# Patient Record
Sex: Female | Born: 1995 | Race: White | Hispanic: No | Marital: Single | Smoking: Never smoker
Health system: Southern US, Community
[De-identification: ages and names within clinical notes are randomized; demographics above are authoritative.]

## PROBLEM LIST (undated history)

## (undated) DIAGNOSIS — T7840XA Allergy, unspecified, initial encounter: Secondary | ICD-10-CM

## (undated) DIAGNOSIS — F419 Anxiety disorder, unspecified: Secondary | ICD-10-CM

## (undated) DIAGNOSIS — D509 Iron deficiency anemia, unspecified: Secondary | ICD-10-CM

## (undated) DIAGNOSIS — O99019 Anemia complicating pregnancy, unspecified trimester: Secondary | ICD-10-CM

## (undated) HISTORY — DX: Allergy, unspecified, initial encounter: T78.40XA

## (undated) HISTORY — DX: Iron deficiency anemia, unspecified: D50.9

## (undated) HISTORY — PX: NO PAST SURGERIES: SHX2092

## (undated) HISTORY — DX: Anemia complicating pregnancy, unspecified trimester: O99.019

## (undated) HISTORY — DX: Anxiety disorder, unspecified: F41.9

---
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
## 2016-04-12 ENCOUNTER — Emergency Department
Admission: EM | Admit: 2016-04-12 | Discharge: 2016-04-12 | Disposition: A | Discharge status: Home / Self Care | Payer: Medicaid Other | Attending: Emergency Medicine | Admitting: Emergency Medicine

## 2016-04-12 ENCOUNTER — Encounter: Payer: Self-pay | Admitting: Urgent Care

## 2016-04-12 DIAGNOSIS — N3001 Acute cystitis with hematuria: Secondary | ICD-10-CM

## 2016-04-12 DIAGNOSIS — Z79899 Other long term (current) drug therapy: Secondary | ICD-10-CM | POA: Insufficient documentation

## 2016-04-12 LAB — URINALYSIS, COMPLETE (UACMP) WITH MICROSCOPIC
Bilirubin Urine: NEGATIVE
Glucose, UA: NEGATIVE mg/dL
Ketones, ur: NEGATIVE mg/dL
Nitrite: NEGATIVE
PROTEIN: NEGATIVE mg/dL
SPECIFIC GRAVITY, URINE: 1.013 (ref 1.005–1.030)
pH: 6 (ref 5.0–8.0)

## 2016-04-12 LAB — POCT PREGNANCY, URINE: PREG TEST UR: NEGATIVE

## 2016-04-12 MED ORDER — PHENAZOPYRIDINE HCL 100 MG PO TABS: 100.0000 mg | ORAL_TABLET | Freq: Three times a day (TID) | ORAL | 0 refills | Status: AC | PRN

## 2016-04-12 MED ORDER — SULFAMETHOXAZOLE-TRIMETHOPRIM 800-160 MG PO TABS: 1.0000 | ORAL_TABLET | Freq: Two times a day (BID) | ORAL | 0 refills | Status: DC

## 2016-04-12 NOTE — ED Triage Notes (Signed)
Patient presents with complaints of lumbosacral pain that has been worsening for 2 weeks. Patient with a physically demanding job; attributed back pain to her work. Patient reports that she woke up this morning "feeling bad"; thought that she had "a bug" - went back to bed and woke up with suprapubic pain. (+) dysuria with gross hematuria reported by patient.

## 2016-04-12 NOTE — ED Provider Notes (Signed)
[facility] Emergency Department Provider Note  ____________________________________________  Time seen: Approximately 7:52 PM  I have reviewed the triage vital signs and the nursing notes.   HISTORY  Chief Complaint Back Pain and Dysuria    HPI Kristen Estrada is a 21 y.o. female , NAD, presents to the emergency department with 2 day history of back pain, frequent urination and hematuria. Patient states she has had lower back pain over the last couple of weeks. Woke today with some mild nausea and vomiting. States she took a nap and when she woke up she had increased frequency of urination and some blood in the urine. Denies any fevers, chills or body aches. Has had no injury or trauma to the abdomen. Denies any pelvic pain, vaginal discharge or vaginal bleeding. Denies any radiation of her back pain. Denies any abdominal pain. No history of kidney stones. Denies any lower extremity swelling or edema.   Past Medical History:  Diagnosis Date  . Allergy    Seasonal  . Anxiety   . Iron deficiency anemia of pregnancy 2014    Patient Active Problem List   Diagnosis Date Noted  . Absolute anemia 09/17/2012    Past Surgical History:  Procedure Laterality Date  . NO PAST SURGERIES     Confirmed 03/22/15    Prior to Admission medications   Medication Sig Start Date End Date Taking? Authorizing Provider  etonogestrel (NEXPLANON) 68 MG IMPL implant 1 each by Subdermal route once. 10/19/12   Historical Provider, MD  phenazopyridine (PYRIDIUM) 100 MG tablet Take 1 tablet (100 mg total) by mouth 3 (three) times daily as needed for pain (May take 1-2 as needed three times daily). 04/12/16   Jami L Hagler, PA-C  sertraline (ZOLOFT) 50 MG tablet Take 50 mg by mouth daily.    Historical Provider, MD  sulfamethoxazole-trimethoprim (BACTRIM DS,SEPTRA DS) 800-160 MG tablet Take 1 tablet by mouth 2 (two) times daily. 04/12/16   Jami L Hagler, PA-C    Allergies Patient has  no known allergies.  Family History  Problem Relation Age of Onset  . Depression Mother     Social History Social History  Substance Use Topics  . Smoking status: Never Smoker  . Smokeless tobacco: Never Used  . Alcohol use No     Review of Systems  Constitutional: No fever/chills Cardiovascular: No chest pain. Respiratory: No shortness of breath.  Gastrointestinal:Positive nausea and vomiting. No abdominal pain.  No diarrhea.  No constipation. Genitourinary: Positive increased urinary frequency, hematuria. Negative for dysuria, pelvic pain, vaginal discharge, vaginal bleeding. No urinary hesitancy, urgency. Musculoskeletal: Positive for back pain without radiation.  Skin: Negative for rash. Neurological: Negative for saddle paresthesias or loss of bowel or bladder control  ____________________________________________   PHYSICAL EXAM:  VITAL SIGNS: ED Triage Vitals  Enc Vitals Group     BP 04/12/16 1907 123/80     Pulse Rate 04/12/16 1907 92     Resp 04/12/16 1907 16     Temp 04/12/16 1907 98.9 F (37.2 C)     Temp Source 04/12/16 1907 Oral     SpO2 04/12/16 1907 98 %     Weight 04/12/16 1907 150 lb (68 kg)     Height 04/12/16 1907 5\' 3"  (1.6 m)     Head Circumference --      Peak Flow --      Pain Score 04/12/16 1908 6     Pain Loc --      Pain  Edu? --      Excl. in GC? --      Constitutional: Alert and oriented. Well appearing and in no acute distress. Eyes: Conjunctivae are normal.  Head: Atraumatic. Neck: Supple with full range of motion. Hematological/Lymphatic/Immunilogical: No cervical lymphadenopathy. Cardiovascular: Normal rate, regular rhythm. Normal S1 and S2.  Good peripheral circulation. Respiratory: Normal respiratory effort without tachypnea or retractions. Lungs CTAB breath sounds noted in all lung fields. No wheeze, rhonchi, rales. Gastrointestinal: Mild tenderness to deep palpation of the suprapubic region without distention or guarding.  All other quadrants of the abdomen was soft and nontender without distention or guarding. No rebound or rigidity. No CVA tenderness. Musculoskeletal: No lower extremity tenderness nor edema.  No joint effusions. Neurologic:  Normal speech and language. No gross focal neurologic deficits are appreciated.  Skin:  Skin is warm, dry and intact. No rash noted. Psychiatric: Mood and affect are normal. Speech and behavior are normal. Patient exhibits appropriate insight and judgement.   ____________________________________________   LABS (all labs ordered are listed, but only abnormal results are displayed)  Labs Reviewed  URINALYSIS, COMPLETE (UACMP) WITH MICROSCOPIC - Abnormal; Notable for the following:       Result Value   Color, Urine STRAW (*)    APPearance CLEAR (*)    Hgb urine dipstick LARGE (*)    Leukocytes, UA MODERATE (*)    Bacteria, UA RARE (*)    Squamous Epithelial / LPF 0-5 (*)    All other components within normal limits  URINE CULTURE  POCT PREGNANCY, URINE   ____________________________________________  EKG  None ____________________________________________  RADIOLOGY  None ____________________________________________    PROCEDURES  Procedure(s) performed: None   Procedures   Medications - No data to display   ____________________________________________   INITIAL IMPRESSION / ASSESSMENT AND PLAN / ED COURSE  Pertinent labs & imaging results that were available during my care of the patient were reviewed by me and considered in my medical decision making (see chart for details).  Clinical Course     Patient's diagnosis is consistent with Acute cystitis with hematuria. Patient will be discharged home with prescriptions for Bactrim DS and Pyridium to take as directed. Patient is encouraged to increase water intake and urinate often. Patient is to follow up with [facility] if symptoms persist  past this treatment course. Patient is given ED precautions to return to the ED for any worsening or new symptoms.    ____________________________________________  FINAL CLINICAL IMPRESSION(S) / ED DIAGNOSES  Final diagnoses:  Acute cystitis with hematuria      NEW MEDICATIONS STARTED DURING THIS VISIT:  Discharge Medication List as of 04/12/2016  7:59 PM    START taking these medications   Details  phenazopyridine (PYRIDIUM) 100 MG tablet Take 1 tablet (100 mg total) by mouth 3 (three) times daily as needed for pain (May take 1-2 as needed three times daily)., Starting Sat 04/12/2016, Print    sulfamethoxazole-trimethoprim (BACTRIM DS,SEPTRA DS) 800-160 MG tablet Take 1 tablet by mouth 2 (two) times daily., Starting Sat 04/12/2016, Print             Hope PigeonJami L Hagler, PA-C 04/12/16 2008    Myrna Blazeravid Matthew Schaevitz, MD 04/12/16 2252

## 2016-04-12 NOTE — Discharge Instructions (Signed)
Rest.  Push fluids. Take medications as prescribed and to completion.

## 2016-04-15 LAB — URINE CULTURE: Special Requests: NORMAL

## 2016-04-16 NOTE — Progress Notes (Signed)
MEDICATION RELATED CONSULT NOTE - INITIAL   Pharmacy Consult for ED culture results   No Known Allergies  Patient Measurements: Height: 5\' 3"  (160 cm) Weight: 150 lb (68 kg) IBW/kg (Calculated) : 52.4  Microbiology: Recent Results (from the past 720 hour(s))  Urine culture     Status: Abnormal   Collection Time: 04/12/16  7:08 PM  Result Value Ref Range Status   Specimen Description URINE, CLEAN CATCH  Final   Special Requests Normal  Final   Culture >=100,000 COLONIES/mL ESCHERICHIA COLI (A)  Final   Report Status 04/15/2016 FINAL  Final   Organism ID, Bacteria ESCHERICHIA COLI (A)  Final      Susceptibility   Escherichia coli - MIC*    AMPICILLIN >=32 RESISTANT Resistant     CEFAZOLIN <=4 SENSITIVE Sensitive     CEFTRIAXONE <=1 SENSITIVE Sensitive     CIPROFLOXACIN <=0.25 SENSITIVE Sensitive     GENTAMICIN 8 INTERMEDIATE Intermediate     IMIPENEM <=0.25 SENSITIVE Sensitive     NITROFURANTOIN 32 SENSITIVE Sensitive     TRIMETH/SULFA >=320 RESISTANT Resistant     AMPICILLIN/SULBACTAM >=32 RESISTANT Resistant     PIP/TAZO <=4 SENSITIVE Sensitive     Extended ESBL NEGATIVE Sensitive     * >=100,000 COLONIES/mL ESCHERICHIA COLI     Assessment: 21 yo female seen in ED on 1/13 with UTI/Cystitis. Patient Discharged on Bactrim BID x 7 days.  Cultures growing E.Coli resistant to Bactrim.   Plan:  After discussion with Dr. Presley RaddleJohnathan Williams, Will change Abx to Keflex 500mg  BID x 7 days.  Spoke with patient at 1415 on 1/17 and reviewed UCx results and explained need for change of Abx. Patient verbalized understanding that she was to start new Abx (Keflex) and stop taking Bactrim at this time.  RX was called into [facility] on [address] in [place] per patient's request.   Gardner CandleSheema M Dymond Gutt, PharmD, BCPS Clinical Pharmacist 04/16/2016 3:04 PM

## 2016-05-16 ENCOUNTER — Emergency Department
Admission: EM | Admit: 2016-05-16 | Discharge: 2016-05-16 | Disposition: A | Discharge status: Home / Self Care | Payer: Self-pay | Attending: Emergency Medicine | Admitting: Emergency Medicine

## 2016-05-16 ENCOUNTER — Encounter: Payer: Self-pay | Admitting: Emergency Medicine

## 2016-05-16 ENCOUNTER — Emergency Department: Payer: Self-pay

## 2016-05-16 DIAGNOSIS — Z79899 Other long term (current) drug therapy: Secondary | ICD-10-CM | POA: Insufficient documentation

## 2016-05-16 DIAGNOSIS — M545 Low back pain, unspecified: Secondary | ICD-10-CM

## 2016-05-16 DIAGNOSIS — R102 Pelvic and perineal pain: Secondary | ICD-10-CM

## 2016-05-16 DIAGNOSIS — N3090 Cystitis, unspecified without hematuria: Secondary | ICD-10-CM | POA: Insufficient documentation

## 2016-05-16 LAB — URINALYSIS, ROUTINE W REFLEX MICROSCOPIC
BILIRUBIN URINE: NEGATIVE
Glucose, UA: NEGATIVE mg/dL
Hgb urine dipstick: NEGATIVE
Ketones, ur: NEGATIVE mg/dL
NITRITE: NEGATIVE
PH: 6 (ref 5.0–8.0)
Protein, ur: NEGATIVE mg/dL
SPECIFIC GRAVITY, URINE: 1.018 (ref 1.005–1.030)

## 2016-05-16 LAB — POCT PREGNANCY, URINE: PREG TEST UR: NEGATIVE

## 2016-05-16 MED ORDER — SULFAMETHOXAZOLE-TRIMETHOPRIM 800-160 MG PO TABS: 1.0000 | ORAL_TABLET | Freq: Two times a day (BID) | ORAL | 0 refills | Status: AC

## 2016-05-16 NOTE — Discharge Instructions (Signed)
Take the prescription meds as directed. Follow[facility]up with [facility] as scheduled. Return to the ED for any worsening symptoms. Take the antibiotics as directed.

## 2016-05-16 NOTE — ED Provider Notes (Signed)
[facility] Emergency Department Provider Note ____________________________________________  Time seen: 1515  I have reviewed the triage vital signs and the nursing notes.  HISTORY  Chief Complaint  Back Pain  HPI Kristen Estrada is a 21 y.o. female presents to the ED for evaluation of 1-day complaint of back pain with referral to the right pelvis. She denies fevers, chills, sweats, or dysuria. She dose ibuprofen with limited relief of her pain.  She is "late" for her menses, reporting a LMP of 04/10/16. She is without nausea, vomiting, or diarrhea. She denies any recent injury, accident, or trauma. She denies vaginal discharge, abnormal vaginal bleeding, or dyspareunia.   Past Medical History:  Diagnosis Date  . Allergy    Seasonal  . Anxiety   . Iron deficiency anemia of pregnancy 2014    Patient Active Problem List   Diagnosis Date Noted  . Absolute anemia 09/17/2012    Past Surgical History:  Procedure Laterality Date  . NO PAST SURGERIES     Confirmed 03/22/15    Prior to Admission medications   Medication Sig Start Date End Date Taking? Authorizing Provider  etonogestrel (NEXPLANON) 68 MG IMPL implant 1 each by Subdermal route once. 10/19/12   Historical Provider, MD  phenazopyridine (PYRIDIUM) 100 MG tablet Take 1 tablet (100 mg total) by mouth 3 (three) times daily as needed for pain (May take 1-2 as needed three times daily). 04/12/16   Jami L Hagler, PA-C  sertraline (ZOLOFT) 50 MG tablet Take 50 mg by mouth daily.    Historical Provider, MD  sulfamethoxazole-trimethoprim (BACTRIM DS,SEPTRA DS) 800-160 MG tablet Take 1 tablet by mouth 2 (two) times daily. 05/16/16   Charlesetta IvoryJenise V Bacon Viveca Beckstrom, PA-C    Allergies Patient has no known allergies.  Family History  Problem Relation Age of Onset  . Depression Mother     Social History Social History  Substance Use Topics  . Smoking status: Never Smoker  . Smokeless tobacco: Never Used  . Alcohol  use No    Review of Systems  Constitutional: Negative for fever. Cardiovascular: Negative for chest pain. Respiratory: Negative for shortness of breath. Genitourinary: Negative for dysuria. Musculoskeletal: Positive for back pain. Skin: Negative for rash. Neurological: Negative for headaches, focal weakness or numbness. ____________________________________________  PHYSICAL EXAM:  VITAL SIGNS: ED Triage Vitals  Enc Vitals Group     BP 05/16/16 1422 121/77     Pulse Rate 05/16/16 1422 93     Resp 05/16/16 1422 20     Temp 05/16/16 1422 99.1 F (37.3 C)     Temp Source 05/16/16 1422 Oral     SpO2 05/16/16 1422 97 %     Weight 05/16/16 1423 170 lb (77.1 kg)     Height 05/16/16 1423 5\' 3"  (1.6 m)     Head Circumference --      Peak Flow --      Pain Score 05/16/16 1423 5     Pain Loc --      Pain Edu? --      Excl. in GC? --    Constitutional: Alert and oriented. Well appearing and in no distress. Head: Normocephalic and atraumatic. Cardiovascular: Normal rate, regular rhythm. Normal distal pulses. Respiratory: Normal respiratory effort. No wheezes/rales/rhonchi. Gastrointestinal: Soft and nontender. No distention, rebound, guarding, or rigidity. No CVA tenderness. Mildly tender to palp over the right lower pelvic region. GU: Declined by patient Musculoskeletal: Normal spinal alignment without midline tenderness, spasm, or deformity. Nontender with normal  range of motion in all extremities.  Neurologic:  Normal gait without ataxia. Normal speech and language. No gross focal neurologic deficits are appreciated. Skin:  Skin is warm, dry and intact. No rash noted. ____________________________________________   LABS (pertinent positives/negatives) Labs Reviewed  URINALYSIS, ROUTINE W REFLEX MICROSCOPIC - Abnormal; Notable for the following:       Result Value   Color, Urine YELLOW (*)    APPearance HAZY (*)    Leukocytes, UA LARGE (*)    Bacteria, UA RARE (*)    Squamous  Epithelial / LPF 6-30 (*)    All other components within normal limits  POC URINE PREG, ED  POCT PREGNANCY, URINE  ____________________________________________   RADIOLOGY  Pelvic/Transvaginal US IMPRESSION: Normal pelvic ultrasound. ____________________________________________  INITIAL IMPRESSION / ASSESSMENT AND PLAN / ED COURSE  Female patient with a reassuring pelvic ultrasound with her sudden onset of right-sided low back pain with referral into the right pelvis. Patient's exam is otherwise benign at this time. The patient has declined a pelvic exam for this complaint. She is advised her symptoms cannot be fully evaluated without a pelvic exam. She reports she is scheduled to see her OB provider for her routine annual exam next week. She is further advised that she should return to the ED immediately for any worsening pain, bleeding, or disability. He is discharged with a prescription for Bactrim for mild cystitis. She will return to the ED as needed. ____________________________________________  FINAL CLINICAL IMPRESSION(S) / ED DIAGNOSES  Final diagnoses:  Pelvic pain  Acute right-sided low back pain without sciatica  Cystitis      Lissa Hoard, PA-C 05/16/16 1806    Phineas Semen, MD 05/16/16 2313

## 2016-05-16 NOTE — ED Triage Notes (Signed)
Pt with low back pain started yesterday.

## 2017-06-10 IMAGING — US US TRANSVAGINAL NON-OB
1 series · 14 of 25 positions shown · non-contrast
Comparison: None

CLINICAL DATA: Pelvic pain, right lower quadrant pain for 1

EXAM:
TRANSABDOMINAL AND TRANSVAGINAL ULTRASOUND OF PELVIS
TECHNIQUE: Both transabdominal and transvaginal ultrasound examinations of the
pelvis were performed. Transabdominal technique was performed for
global imaging of the pelvis including uterus, ovaries, adnexal
regions, and pelvic cul-de-sac. It was necessary to proceed with
endovaginal exam following the transabdominal exam to visualize the
endometrium and ovaries.

[Series 1: us transvaginal non-ob · 0.22mm/px · 14 of 92 slices shown]
[im 1/92]
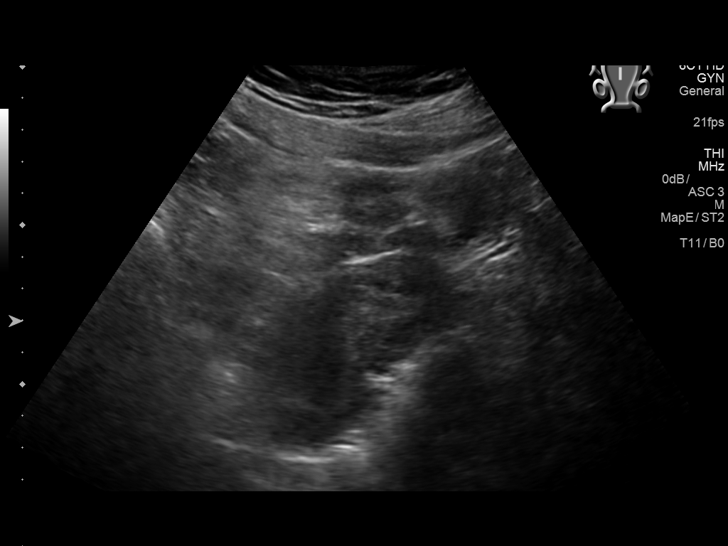
[im 8/92]
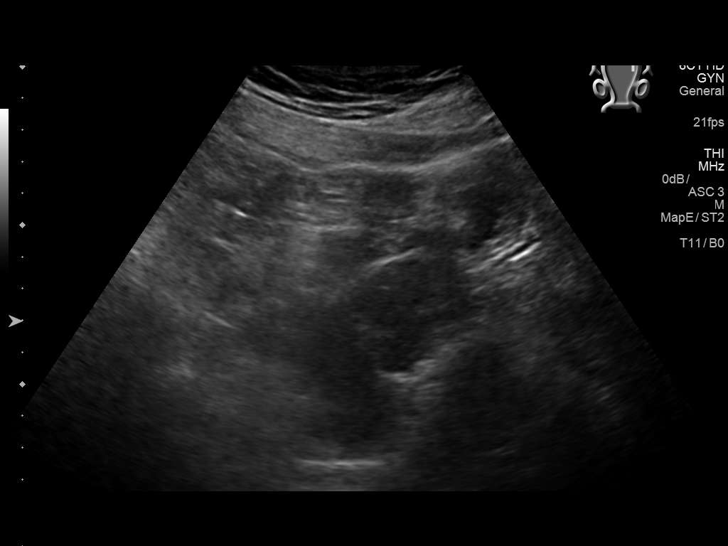
[im 16/92]
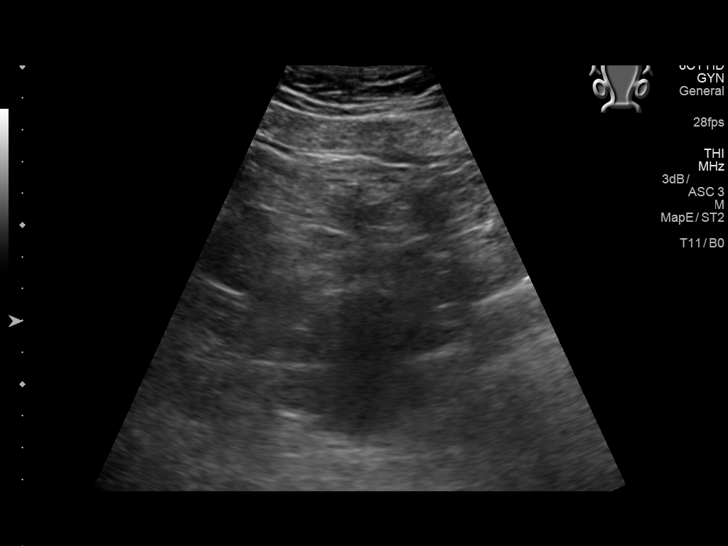
[im 23/92]
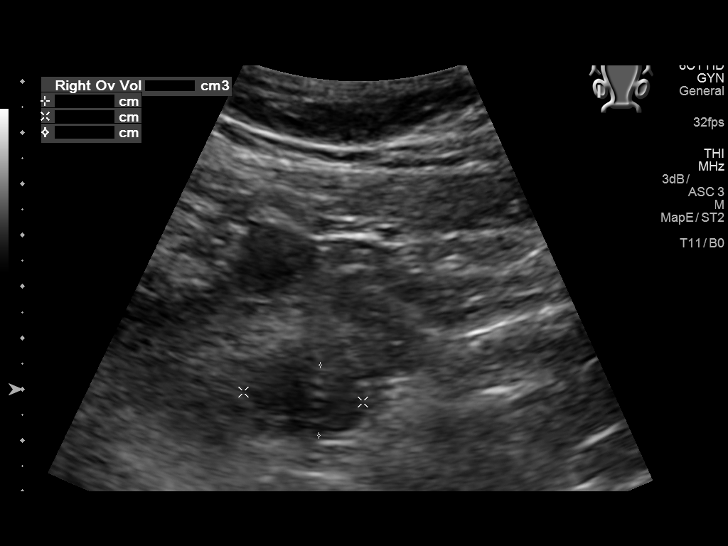
[im 31/92]
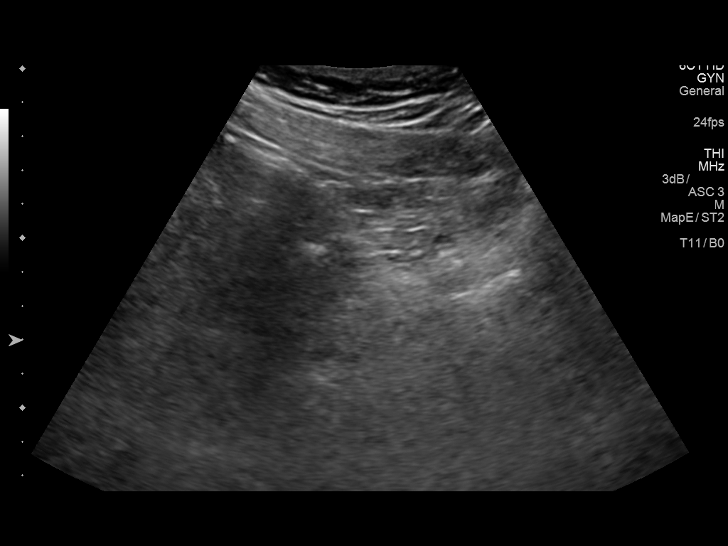
[im 35/92]
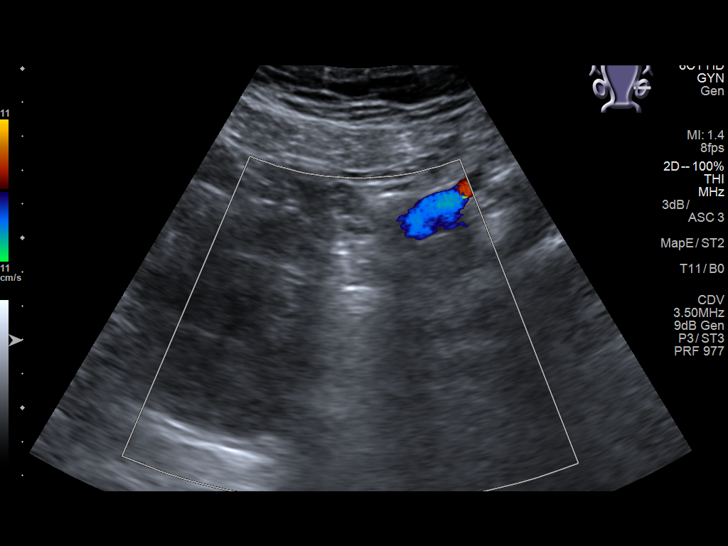
[im 42/92]
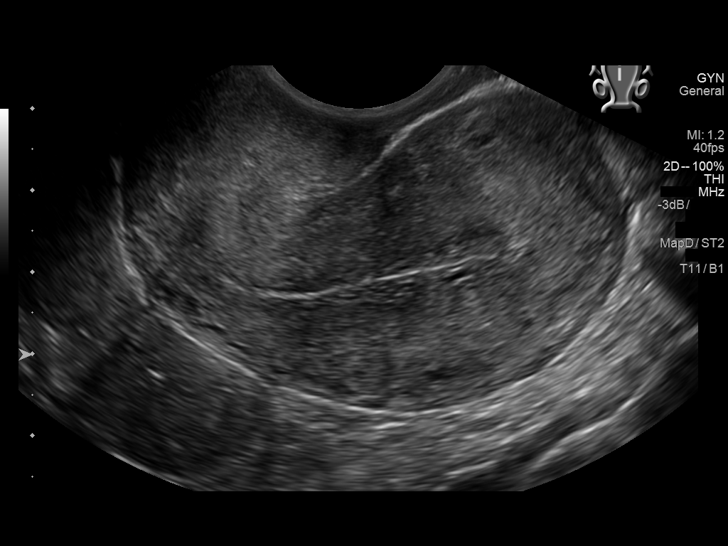
[im 50/92]
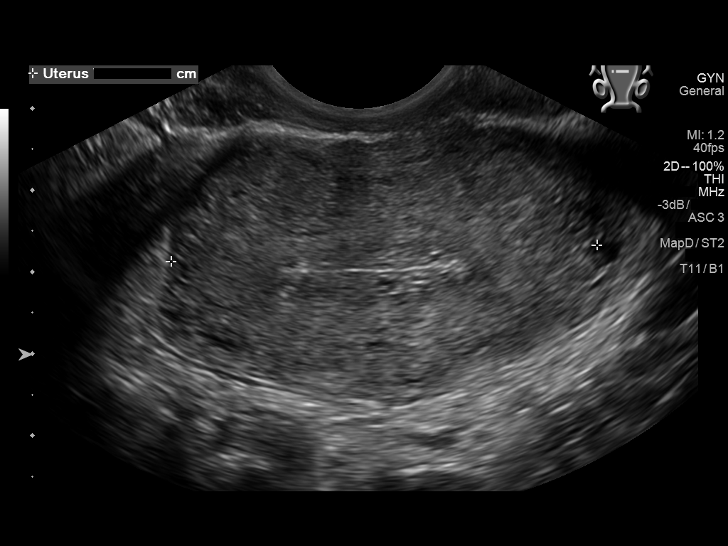
[im 57/92]
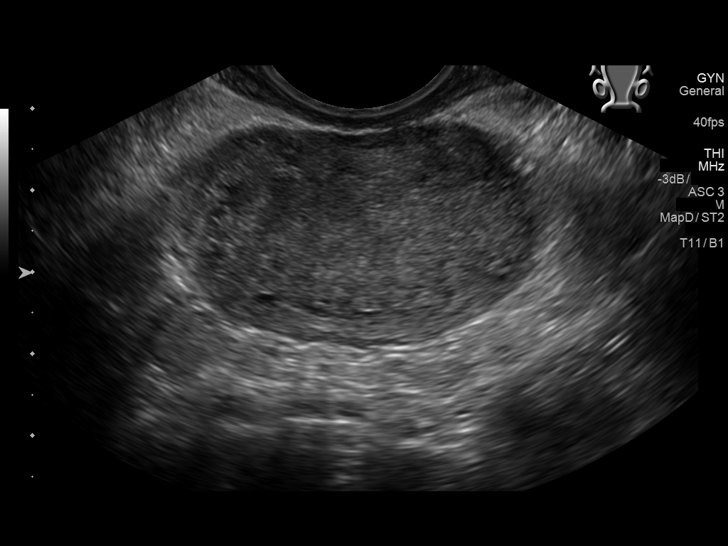
[im 61/92]
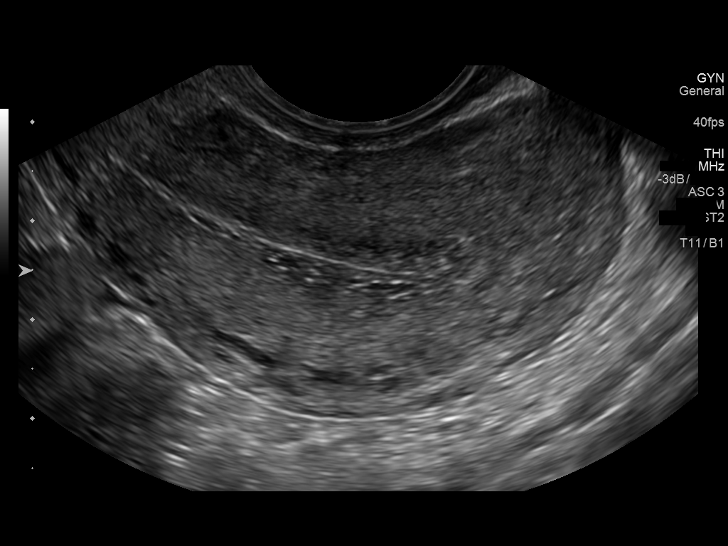
[im 69/92]
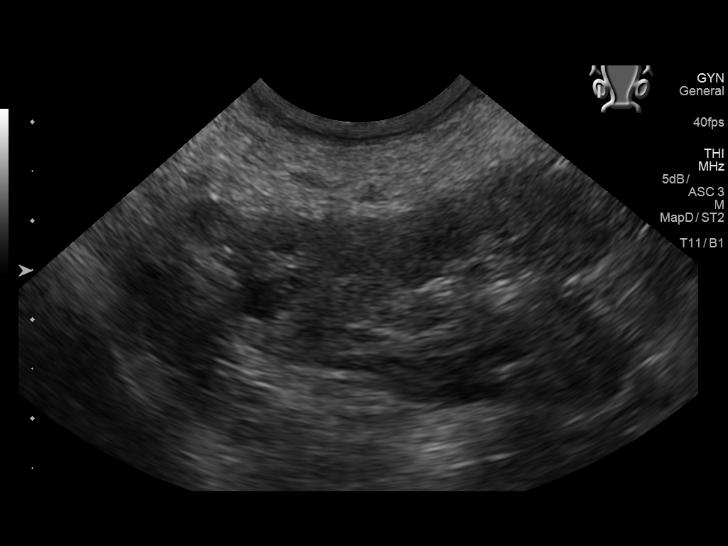
[im 76/92]
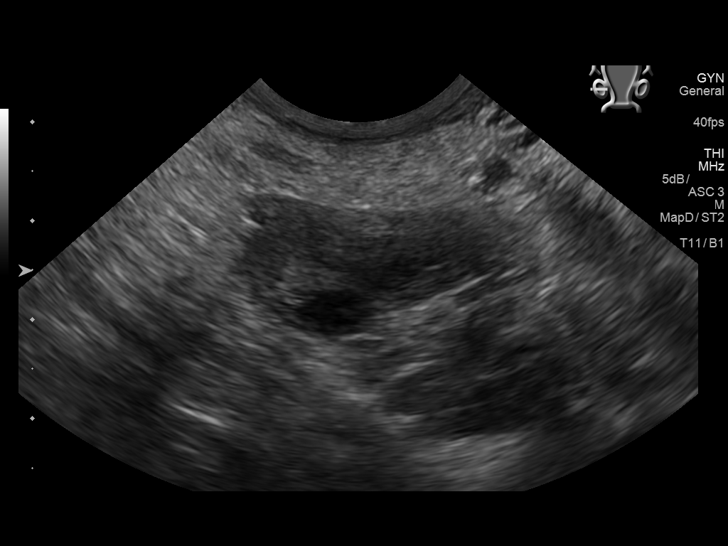
[im 84/92]
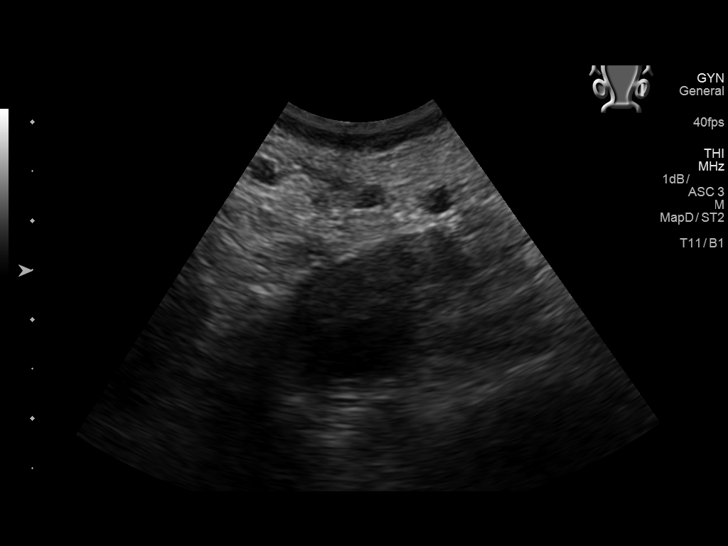
[im 92/92]
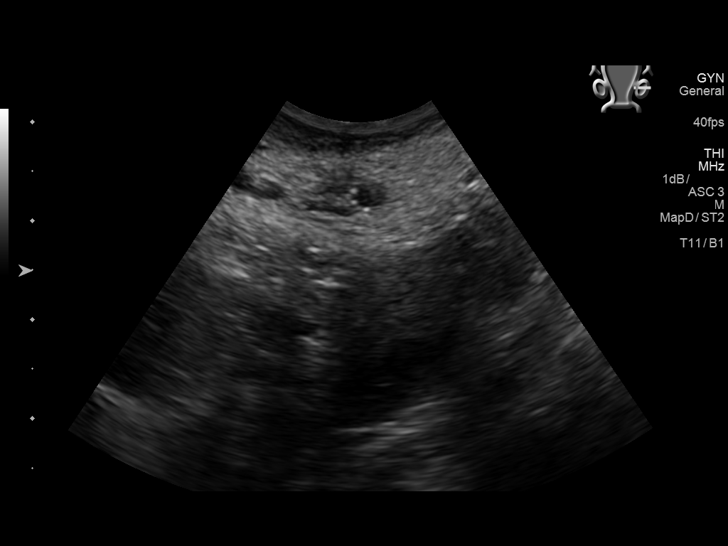

[14 of 25 positions shown; findings below may reference images not displayed]

FINDINGS: Uterus

Measurements: 8.1 x 3.3 x 5.2 cm. No fibroids or other mass
visualized.

Endometrium

Thickness: 5.4 mm.  No focal abnormality visualized.

Right ovary

Measurements: 2.9 x 1.7 x 2.1 cm. Normal appearance/no adnexal mass.

Left ovary

Measurements: 2.5 x 1.1 x 1.5 cm. Normal appearance/no adnexal mass.

Other findings

No abnormal free fluid.
IMPRESSION: Normal pelvic ultrasound.
